# Patient Record
Sex: Male | Born: 1980 | Race: White | Hispanic: No | Marital: Single
Health system: Southern US, Community
[De-identification: ages and names within clinical notes are randomized; demographics above are authoritative.]

---

## 2002-09-18 ENCOUNTER — Ambulatory Visit (HOSPITAL_COMMUNITY): Admission: RE | Admit: 2002-09-18 | Discharge: 2002-09-18 | Payer: Self-pay | Admitting: Surgical Oncology

## 2019-04-01 DIAGNOSIS — J9811 Atelectasis: Secondary | ICD-10-CM

## 2019-04-01 DIAGNOSIS — D72829 Elevated white blood cell count, unspecified: Secondary | ICD-10-CM

## 2019-04-01 DIAGNOSIS — D473 Essential (hemorrhagic) thrombocythemia: Secondary | ICD-10-CM

## 2019-04-01 DIAGNOSIS — R109 Unspecified abdominal pain: Secondary | ICD-10-CM

## 2019-04-01 DIAGNOSIS — M7989 Other specified soft tissue disorders: Secondary | ICD-10-CM

## 2019-04-02 DIAGNOSIS — R7881 Bacteremia: Secondary | ICD-10-CM

## 2020-04-15 ENCOUNTER — Other Ambulatory Visit: Payer: Self-pay

## 2020-04-15 ENCOUNTER — Encounter (HOSPITAL_COMMUNITY): Payer: Self-pay

## 2020-04-15 ENCOUNTER — Ambulatory Visit (HOSPITAL_COMMUNITY)
Admission: EM | Admit: 2020-04-15 | Discharge: 2020-04-16 | Disposition: A | Discharge status: Home / Self Care | Payer: Self-pay | Attending: Orthopedic Surgery | Admitting: Orthopedic Surgery

## 2020-04-15 ENCOUNTER — Emergency Department (HOSPITAL_COMMUNITY): Payer: Self-pay

## 2020-04-15 DIAGNOSIS — Z20822 Contact with and (suspected) exposure to covid-19: Secondary | ICD-10-CM | POA: Insufficient documentation

## 2020-04-15 DIAGNOSIS — Z23 Encounter for immunization: Secondary | ICD-10-CM | POA: Insufficient documentation

## 2020-04-15 DIAGNOSIS — Y939 Activity, unspecified: Secondary | ICD-10-CM | POA: Insufficient documentation

## 2020-04-15 DIAGNOSIS — S64492A Injury of digital nerve of right middle finger, initial encounter: Secondary | ICD-10-CM | POA: Insufficient documentation

## 2020-04-15 DIAGNOSIS — S68620A Partial traumatic transphalangeal amputation of right index finger, initial encounter: Secondary | ICD-10-CM | POA: Insufficient documentation

## 2020-04-15 DIAGNOSIS — W409XXA Explosion of unspecified explosive materials, initial encounter: Secondary | ICD-10-CM | POA: Insufficient documentation

## 2020-04-15 DIAGNOSIS — S66221A Laceration of extensor muscle, fascia and tendon of right thumb at wrist and hand level, initial encounter: Secondary | ICD-10-CM | POA: Insufficient documentation

## 2020-04-15 DIAGNOSIS — S68622A Partial traumatic transphalangeal amputation of right middle finger, initial encounter: Secondary | ICD-10-CM | POA: Insufficient documentation

## 2020-04-15 DIAGNOSIS — S65512A Laceration of blood vessel of right middle finger, initial encounter: Secondary | ICD-10-CM | POA: Insufficient documentation

## 2020-04-15 DIAGNOSIS — S68521A Partial traumatic transphalangeal amputation of right thumb, initial encounter: Secondary | ICD-10-CM | POA: Insufficient documentation

## 2020-04-15 DIAGNOSIS — S68629A Partial traumatic transphalangeal amputation of unspecified finger, initial encounter: Secondary | ICD-10-CM

## 2020-04-15 DIAGNOSIS — Z880 Allergy status to penicillin: Secondary | ICD-10-CM | POA: Insufficient documentation

## 2020-04-15 LAB — COMPREHENSIVE METABOLIC PANEL
ALT: 17 U/L (ref 0–44)
AST: 32 U/L (ref 15–41)
Albumin: 3.7 g/dL (ref 3.5–5.0)
Alkaline Phosphatase: 72 U/L (ref 38–126)
Anion gap: 8 (ref 5–15)
BUN: 7 mg/dL (ref 6–20)
CO2: 24 mmol/L (ref 22–32)
Calcium: 9 mg/dL (ref 8.9–10.3)
Chloride: 105 mmol/L (ref 98–111)
Creatinine, Ser: 0.88 mg/dL (ref 0.61–1.24)
GFR, Estimated: >60 mL/min (ref >60)
Glucose, Bld: 135 mg/dL — ABNORMAL HIGH (ref 70–99)
Potassium: 4.1 mmol/L (ref 3.5–5.1)
Sodium: 137 mmol/L (ref 135–145)
Total Bilirubin: 0.9 mg/dL (ref 0.3–1.2)
Total Protein: 6.9 g/dL (ref 6.5–8.1)

## 2020-04-15 LAB — CBC
HCT: 44.2 % (ref 39.0–52.0)
Hemoglobin: 14.4 g/dL (ref 13.0–17.0)
MCH: 30.6 pg (ref 26.0–34.0)
MCHC: 32.6 g/dL (ref 30.0–36.0)
MCV: 93.8 fL (ref 80.0–100.0)
Platelets: 486 10*3/uL — ABNORMAL HIGH (ref 150–400)
RBC: 4.71 MIL/uL (ref 4.22–5.81)
RDW: 13 % (ref 11.5–15.5)
WBC: 14 10*3/uL — ABNORMAL HIGH (ref 4.0–10.5)
nRBC: 0 % (ref 0.0–0.2)

## 2020-04-15 LAB — ABO/RH: ABO/RH(D): B NEG

## 2020-04-15 LAB — TYPE AND SCREEN
ABO/RH(D): B NEG
Antibody Screen: NEGATIVE

## 2020-04-15 MED ORDER — HYDROMORPHONE HCL 1 MG/ML IJ SOLN
1.0000 mg | Freq: Once | INTRAMUSCULAR | Status: AC
  Administered 2020-04-15: 1 mg via INTRAVENOUS
  Filled 2020-04-15: qty 1

## 2020-04-15 MED ORDER — CEFAZOLIN SODIUM-DEXTROSE 2-4 GM/100ML-% IV SOLN
2.0000 g | Freq: Once | INTRAVENOUS | Status: AC
  Administered 2020-04-15: 2 g via INTRAVENOUS
  Filled 2020-04-15: qty 100

## 2020-04-15 MED ORDER — FENTANYL CITRATE (PF) 100 MCG/2ML IJ SOLN
50.0000 ug | INTRAMUSCULAR | Status: DC | PRN
  Administered 2020-04-15 – 2020-04-16 (×3): 50 ug via INTRAVENOUS
  Filled 2020-04-15 (×3): qty 2

## 2020-04-15 MED ORDER — HYDROMORPHONE HCL 1 MG/ML IJ SOLN
1.0000 mg | INTRAMUSCULAR | Status: DC | PRN
  Administered 2020-04-16: 1 mg via INTRAVENOUS
  Filled 2020-04-15: qty 1

## 2020-04-15 MED ORDER — TETANUS-DIPHTH-ACELL PERTUSSIS 5-2.5-18.5 LF-MCG/0.5 IM SUSY
0.5000 mL | PREFILLED_SYRINGE | Freq: Once | INTRAMUSCULAR | Status: AC
  Administered 2020-04-15: 0.5 mL via INTRAMUSCULAR
  Filled 2020-04-15: qty 0.5
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
## 2020-04-15 NOTE — ED Notes (Signed)
Pt completely undressed & in hospital gown

## 2020-04-15 NOTE — ED Notes (Addendum)
Pt endorses regular meth use, 2PPD smoker

## 2020-04-15 NOTE — ED Notes (Signed)
Pt's sister at bedside, given update on pt plan of care

## 2020-04-16 ENCOUNTER — Emergency Department (HOSPITAL_COMMUNITY): Payer: Self-pay | Admitting: Anesthesiology

## 2020-04-16 ENCOUNTER — Emergency Department (HOSPITAL_COMMUNITY): Payer: Self-pay

## 2020-04-16 ENCOUNTER — Encounter (HOSPITAL_COMMUNITY)
Admission: EM | Disposition: A | Discharge status: Home / Self Care | Payer: Self-pay | Source: Home / Self Care | Attending: Emergency Medicine

## 2020-04-16 HISTORY — PX: PERCUTANEOUS PINNING: SHX2209

## 2020-04-16 HISTORY — PX: I & D EXTREMITY: SHX5045

## 2020-04-16 LAB — SARS CORONAVIRUS 2 (TAT 6-24 HRS): SARS Coronavirus 2: NEGATIVE

## 2020-04-16 SURGERY — IRRIGATION AND DEBRIDEMENT EXTREMITY
Anesthesia: General | Laterality: Right

## 2020-04-16 MED ORDER — 0.9 % SODIUM CHLORIDE (POUR BTL) OPTIME
TOPICAL | Status: DC | PRN
  Administered 2020-04-16: 1000 mL

## 2020-04-16 MED ORDER — PROPOFOL 10 MG/ML IV BOLUS
INTRAVENOUS | Status: AC
  Filled 2020-04-16: qty 20

## 2020-04-16 MED ORDER — BUPIVACAINE HCL (PF) 0.25 % IJ SOLN
INTRAMUSCULAR | Status: AC
  Filled 2020-04-16: qty 30

## 2020-04-16 MED ORDER — DEXAMETHASONE SODIUM PHOSPHATE 10 MG/ML IJ SOLN
INTRAMUSCULAR | Status: AC
  Filled 2020-04-16: qty 2

## 2020-04-16 MED ORDER — MIDAZOLAM HCL 5 MG/5ML IJ SOLN
INTRAMUSCULAR | Status: DC | PRN
  Administered 2020-04-16: 2 mg via INTRAVENOUS

## 2020-04-16 MED ORDER — PROPOFOL 10 MG/ML IV BOLUS
INTRAVENOUS | Status: DC | PRN
  Administered 2020-04-16: 200 mg via INTRAVENOUS

## 2020-04-16 MED ORDER — ONDANSETRON HCL 4 MG/2ML IJ SOLN
INTRAMUSCULAR | Status: AC
  Filled 2020-04-16: qty 2

## 2020-04-16 MED ORDER — KETOROLAC TROMETHAMINE 30 MG/ML IJ SOLN
INTRAMUSCULAR | Status: AC
  Filled 2020-04-16: qty 1

## 2020-04-16 MED ORDER — LACTATED RINGERS IV SOLN: INTRAVENOUS | Status: DC | PRN

## 2020-04-16 MED ORDER — SULFAMETHOXAZOLE-TRIMETHOPRIM 800-160 MG PO TABS: 1.0000 | ORAL_TABLET | Freq: Two times a day (BID) | ORAL | 0 refills | Status: AC

## 2020-04-16 MED ORDER — KETAMINE HCL 50 MG/5ML IJ SOSY
PREFILLED_SYRINGE | INTRAMUSCULAR | Status: AC
  Filled 2020-04-16: qty 5

## 2020-04-16 MED ORDER — OXYCODONE HCL 5 MG/5ML PO SOLN
5.0000 mg | Freq: Once | ORAL | Status: DC | PRN
Start: 2020-04-16 — End: 2020-04-16

## 2020-04-16 MED ORDER — OXYCODONE-ACETAMINOPHEN 5-325 MG PO TABS: ORAL_TABLET | ORAL | 0 refills | Status: AC

## 2020-04-16 MED ORDER — FENTANYL CITRATE (PF) 250 MCG/5ML IJ SOLN
INTRAMUSCULAR | Status: AC
  Filled 2020-04-16: qty 5

## 2020-04-16 MED ORDER — FENTANYL CITRATE (PF) 250 MCG/5ML IJ SOLN
INTRAMUSCULAR | Status: DC | PRN
  Administered 2020-04-16: 150 ug via INTRAVENOUS
  Administered 2020-04-16: 50 ug via INTRAVENOUS
  Administered 2020-04-16: 100 ug via INTRAVENOUS

## 2020-04-16 MED ORDER — KETOROLAC TROMETHAMINE 30 MG/ML IJ SOLN
30.0000 mg | Freq: Once | INTRAMUSCULAR | Status: AC
  Administered 2020-04-16: 30 mg via INTRAVENOUS

## 2020-04-16 MED ORDER — CEFAZOLIN SODIUM-DEXTROSE 2-3 GM-%(50ML) IV SOLR
INTRAVENOUS | Status: DC | PRN
  Administered 2020-04-16: 2 g via INTRAVENOUS

## 2020-04-16 MED ORDER — DEXMEDETOMIDINE (PRECEDEX) IN NS 20 MCG/5ML (4 MCG/ML) IV SYRINGE
PREFILLED_SYRINGE | INTRAVENOUS | Status: AC
  Filled 2020-04-16: qty 10

## 2020-04-16 MED ORDER — KETAMINE HCL 10 MG/ML IJ SOLN
INTRAMUSCULAR | Status: DC | PRN
  Administered 2020-04-16: 30 mg via INTRAVENOUS
  Administered 2020-04-16: 20 mg via INTRAVENOUS

## 2020-04-16 MED ORDER — BUPIVACAINE HCL (PF) 0.25 % IJ SOLN
INTRAMUSCULAR | Status: DC | PRN
  Administered 2020-04-16: 20 mL

## 2020-04-16 MED ORDER — HYDROMORPHONE HCL 1 MG/ML IJ SOLN: 0.2500 mg | INTRAMUSCULAR | Status: DC | PRN

## 2020-04-16 MED ORDER — ONDANSETRON HCL 4 MG/2ML IJ SOLN
INTRAMUSCULAR | Status: DC | PRN
  Administered 2020-04-16: 4 mg via INTRAVENOUS

## 2020-04-16 MED ORDER — PROMETHAZINE HCL 25 MG/ML IJ SOLN: 6.2500 mg | INTRAMUSCULAR | Status: DC | PRN

## 2020-04-16 MED ORDER — OXYCODONE HCL 5 MG PO TABS: 5.0000 mg | ORAL_TABLET | Freq: Once | ORAL | Status: DC | PRN

## 2020-04-16 MED ORDER — PHENYLEPHRINE 40 MCG/ML (10ML) SYRINGE FOR IV PUSH (FOR BLOOD PRESSURE SUPPORT)
PREFILLED_SYRINGE | INTRAVENOUS | Status: AC
  Filled 2020-04-16: qty 10

## 2020-04-16 MED ORDER — EPHEDRINE 5 MG/ML INJ
INTRAVENOUS | Status: AC
  Filled 2020-04-16: qty 10

## 2020-04-16 MED ORDER — HYDROMORPHONE HCL 1 MG/ML IJ SOLN
INTRAMUSCULAR | Status: AC
  Filled 2020-04-16: qty 1

## 2020-04-16 MED ORDER — DIPHENHYDRAMINE HCL 50 MG/ML IJ SOLN
INTRAMUSCULAR | Status: DC | PRN
  Administered 2020-04-16: 25 mg via INTRAVENOUS

## 2020-04-16 MED ORDER — CEFAZOLIN SODIUM-DEXTROSE 2-4 GM/100ML-% IV SOLN
INTRAVENOUS | Status: AC
  Filled 2020-04-16: qty 100

## 2020-04-16 MED ORDER — DEXAMETHASONE SODIUM PHOSPHATE 10 MG/ML IJ SOLN
INTRAMUSCULAR | Status: DC | PRN
  Administered 2020-04-16: 4 mg via INTRAVENOUS

## 2020-04-16 MED ORDER — DIPHENHYDRAMINE HCL 50 MG/ML IJ SOLN
INTRAMUSCULAR | Status: AC
  Filled 2020-04-16: qty 1

## 2020-04-16 MED ORDER — SUCCINYLCHOLINE CHLORIDE 20 MG/ML IJ SOLN
INTRAMUSCULAR | Status: DC | PRN
  Administered 2020-04-16: 120 mg via INTRAVENOUS

## 2020-04-16 MED ORDER — ACETAMINOPHEN 10 MG/ML IV SOLN: 1000.0000 mg | Freq: Once | INTRAVENOUS | Status: DC | PRN

## 2020-04-16 MED ORDER — LIDOCAINE 2% (20 MG/ML) 5 ML SYRINGE
INTRAMUSCULAR | Status: AC
  Filled 2020-04-16: qty 15

## 2020-04-16 MED ORDER — LIDOCAINE 2% (20 MG/ML) 5 ML SYRINGE
INTRAMUSCULAR | Status: DC | PRN
  Administered 2020-04-16: 60 mg via INTRAVENOUS

## 2020-04-16 MED ORDER — MIDAZOLAM HCL 2 MG/2ML IJ SOLN
INTRAMUSCULAR | Status: AC
  Filled 2020-04-16: qty 2

## 2020-04-16 MED ORDER — DEXMEDETOMIDINE (PRECEDEX) IN NS 20 MCG/5ML (4 MCG/ML) IV SYRINGE
PREFILLED_SYRINGE | INTRAVENOUS | Status: DC | PRN
  Administered 2020-04-16: 8 ug via INTRAVENOUS
  Administered 2020-04-16: 12 ug via INTRAVENOUS
  Administered 2020-04-16: 20 ug via INTRAVENOUS

## 2020-04-16 SURGICAL SUPPLY — 58 items
ADAPTER CATH SYR TO TUBING 38M (ADAPTER) IMPLANT
BNDG COHESIVE 2X5 TAN STRL LF (GAUZE/BANDAGES/DRESSINGS) IMPLANT
BNDG ELASTIC 3X5.8 VLCR STR LF (GAUZE/BANDAGES/DRESSINGS) ×4 IMPLANT
BNDG ELASTIC 4X5.8 VLCR STR LF (GAUZE/BANDAGES/DRESSINGS) ×2 IMPLANT
BNDG ESMARK 4X9 LF (GAUZE/BANDAGES/DRESSINGS) ×2 IMPLANT
BNDG GAUZE ELAST 4 BULKY (GAUZE/BANDAGES/DRESSINGS) ×2 IMPLANT
CANNULA VESSEL 3MM 2 BLNT TIP (CANNULA) IMPLANT
CORD BIPOLAR FORCEPS 12FT (ELECTRODE) ×2 IMPLANT
COVER SURGICAL LIGHT HANDLE (MISCELLANEOUS) ×2 IMPLANT
COVER WAND RF STERILE (DRAPES) ×2 IMPLANT
CUFF TOURN SGL QUICK 18X4 (TOURNIQUET CUFF) IMPLANT
CUFF TOURN SGL QUICK 24 (TOURNIQUET CUFF)
CUFF TRNQT CYL 24X4X16.5-23 (TOURNIQUET CUFF) IMPLANT
DECANTER SPIKE VIAL GLASS SM (MISCELLANEOUS) ×2 IMPLANT
DRAIN PENROSE 1/4X12 LTX STRL (WOUND CARE) IMPLANT
DRSG PAD ABDOMINAL 8X10 ST (GAUZE/BANDAGES/DRESSINGS) ×4 IMPLANT
GAUZE SPONGE 4X4 12PLY STRL (GAUZE/BANDAGES/DRESSINGS) ×2 IMPLANT
GAUZE XEROFORM 1X8 LF (GAUZE/BANDAGES/DRESSINGS) ×2 IMPLANT
GAUZE XEROFORM 5X9 LF (GAUZE/BANDAGES/DRESSINGS) ×2 IMPLANT
GLOVE BIO SURGEON STRL SZ7.5 (GLOVE) ×2 IMPLANT
GLOVE BIOGEL PI IND STRL 8.5 (GLOVE) IMPLANT
GLOVE BIOGEL PI INDICATOR 8.5 (GLOVE)
GLOVE BIOGEL PI ORTHO PRO SZ8 (GLOVE)
GLOVE PI ORTHO PRO STRL SZ8 (GLOVE) IMPLANT
GLOVE SRG 8 PF TXTR STRL LF DI (GLOVE) ×1 IMPLANT
GLOVE SURG ORTHO 8.0 STRL STRW (GLOVE) IMPLANT
GLOVE SURG UNDER POLY LF SZ8 (GLOVE) ×2
GOWN STRL REUS W/ TWL LRG LVL3 (GOWN DISPOSABLE) ×1 IMPLANT
GOWN STRL REUS W/ TWL XL LVL3 (GOWN DISPOSABLE) ×1 IMPLANT
GOWN STRL REUS W/TWL LRG LVL3 (GOWN DISPOSABLE) ×1
GOWN STRL REUS W/TWL XL LVL3 (GOWN DISPOSABLE) ×2
K-WIRE .035 (WIRE) ×4
KIT BASIN OR (CUSTOM PROCEDURE TRAY) ×2 IMPLANT
KIT TURNOVER KIT B (KITS) ×2 IMPLANT
KWIRE .035 (WIRE) ×2 IMPLANT
LOOP VESSEL MAXI BLUE (MISCELLANEOUS) IMPLANT
MANIFOLD NEPTUNE II (INSTRUMENTS) IMPLANT
NEEDLE HYPO 25X1 1.5 SAFETY (NEEDLE) ×2 IMPLANT
NS IRRIG 1000ML POUR BTL (IV SOLUTION) ×2 IMPLANT
PACK ORTHO EXTREMITY (CUSTOM PROCEDURE TRAY) ×2 IMPLANT
PAD ARMBOARD 7.5X6 YLW CONV (MISCELLANEOUS) ×4 IMPLANT
PAD CAST 3X4 CTTN HI CHSV (CAST SUPPLIES) ×1 IMPLANT
PADDING CAST COTTON 3X4 STRL (CAST SUPPLIES) ×2
SET CYSTO W/LG BORE CLAMP LF (SET/KITS/TRAYS/PACK) ×2 IMPLANT
SOL PREP POV-IOD 4OZ 10% (MISCELLANEOUS) ×4 IMPLANT
SPONGE LAP 4X18 RFD (DISPOSABLE) ×2 IMPLANT
SUT ETHILON 4 0 P 3 18 (SUTURE) IMPLANT
SUT ETHILON 4 0 PS 2 18 (SUTURE) IMPLANT
SUT MERSILENE 4 0 P 3 (SUTURE) ×2 IMPLANT
SUT MON AB 5-0 P3 18 (SUTURE) ×10 IMPLANT
SWAB COLLECTION DEVICE MRSA (MISCELLANEOUS) IMPLANT
SYR 20ML LL LF (SYRINGE) IMPLANT
SYR CONTROL 10ML LL (SYRINGE) IMPLANT
TOWEL GREEN STERILE (TOWEL DISPOSABLE) ×2 IMPLANT
TUBE CONNECTING 12X1/4 (SUCTIONS) ×2 IMPLANT
TUBE FEEDING ENTERAL 5FR 16IN (TUBING) IMPLANT
UNDERPAD 30X36 HEAVY ABSORB (UNDERPADS AND DIAPERS) ×2 IMPLANT
YANKAUER SUCT BULB TIP NO VENT (SUCTIONS) ×2 IMPLANT

## 2020-04-16 NOTE — Anesthesia Preprocedure Evaluation (Addendum)
Anesthesia Evaluation  Patient identified by MRN, date of birth, ID band Patient awake    Reviewed: Allergy & Precautions, NPO status , Patient's Chart, lab work & pertinent test results  Airway Mallampati: II  TM Distance: >3 FB Neck ROM: Full    Dental  (+) Poor Dentition, Missing   Pulmonary Current SmokerPatient did not abstain from smoking.,    Pulmonary exam normal breath sounds clear to auscultation       Cardiovascular negative cardio ROS Normal cardiovascular exam Rhythm:Regular Rate:Normal     Neuro/Psych negative neurological ROS     GI/Hepatic negative GI ROS, (+)     substance abuse  methamphetamine use,   Endo/Other  negative endocrine ROS  Renal/GU negative Renal ROS     Musculoskeletal partial amputation of right thumb, pointer & middle finger   Abdominal (+) + obese,   Peds  Hematology negative hematology ROS (+)   Anesthesia Other Findings RIGHT HAND EXPLOSION  Reproductive/Obstetrics                            Anesthesia Physical Anesthesia Plan  ASA: II and emergent  Anesthesia Plan: General   Post-op Pain Management:    Induction: Intravenous  PONV Risk Score and Plan: 2 and Ondansetron, Dexamethasone, Midazolam and Treatment may vary due to age or medical condition  Airway Management Planned: Oral ETT  Additional Equipment:   Intra-op Plan:   Post-operative Plan: Extubation in OR  Informed Consent: I have reviewed the patients History and Physical, chart, labs and discussed the procedure including the risks, benefits and alternatives for the proposed anesthesia with the patient or authorized representative who has indicated his/her understanding and acceptance.     Dental advisory given  Plan Discussed with: CRNA  Anesthesia Plan Comments:        Anesthesia Quick Evaluation

## 2020-04-16 NOTE — Anesthesia Postprocedure Evaluation (Signed)
Anesthesia Post Note  Patient: Russell Montoya  Procedure(s) Performed: IRRIGATION AND DEBRIDEMENT, REVISION OF AMPUTATION, REPAIR OF MULTIPLE LACERATIONS (Right ) PERCUTANEOUS PINNING RIGHT THUMB AND MIDDLE FINGER (Right )     Patient location during evaluation: PACU Anesthesia Type: General Level of consciousness: awake and alert Pain management: pain level controlled Vital Signs Assessment: post-procedure vital signs reviewed and stable Respiratory status: spontaneous breathing, nonlabored ventilation, respiratory function stable and patient connected to nasal cannula oxygen Cardiovascular status: blood pressure returned to baseline and stable Postop Assessment: no apparent nausea or vomiting Anesthetic complications: no   No complications documented.  Last Vitals:  Vitals:   04/16/20 1000 04/16/20 1030  BP: 124/70 124/67  Pulse: 67 73  Resp: 18 19  Temp:  (!) 36.3 C  SpO2: 99% 96%    Last Pain:  Vitals:   04/16/20 1030  TempSrc:   PainSc: 0-No pain                 Tiajuana Amass

## 2020-04-16 NOTE — Anesthesia Procedure Notes (Signed)
Procedure Name: Intubation Date/Time: 04/16/2020 4:23 AM Performed by: Crosby Oriordan T, CRNA Pre-anesthesia Checklist: Patient identified, Emergency Drugs available, Suction available and Patient being monitored Patient Re-evaluated:Patient Re-evaluated prior to induction Oxygen Delivery Method: Circle system utilized Preoxygenation: Pre-oxygenation with 100% oxygen Induction Type: IV induction Ventilation: Mask ventilation without difficulty Laryngoscope Size: Mac and 4 Grade View: Grade I Tube type: Oral Tube size: 7.0 mm Number of attempts: 1 Airway Equipment and Method: Stylet and Oral airway Placement Confirmation: ETT inserted through vocal cords under direct vision,  positive ETCO2 and breath sounds checked- equal and bilateral Secured at: 22 cm Tube secured with: Tape Dental Injury: Teeth and Oropharynx as per pre-operative assessment
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
## 2020-04-16 NOTE — Discharge Instructions (Addendum)
[facility] Instructions Hand Surgery  Wound Care: Keep your hand elevated above the level of your heart.  Do not allow it to dangle by your side.  Keep the dressing dry and do not remove it unless your doctor advises you to do so.  He will usually change it at the time of your post-op visit.  Moving your fingers is advised to stimulate circulation but will depend on the site of your surgery.  If you have a splint applied, your doctor will advise you regarding movement.  Activity: Do not drive or operate machinery today.  Rest today and then you may return to your normal activity and work as indicated by your physician.  Diet:  Drink liquids today or eat a light diet.  You may resume a regular diet tomorrow.    General expectations: Pain for two to three days. Fingers may become slightly swollen.  Call your doctor if any of the following occur: Severe pain not relieved by pain medication. Elevated temperature. Dressing soaked with blood. Inability to move fingers. White or bluish color to fingers.   General Anesthesia, Adult, Care After This sheet gives you information about how to care for yourself after your procedure. Your health care provider may also give you more specific instructions. If you have problems or questions, contact your health care provider. What can I expect after the procedure? After the procedure, the following side effects are common:  Pain or discomfort at the IV site.  Nausea.  Vomiting.  Sore throat.  Trouble concentrating.  Feeling cold or chills.  Feeling weak or tired.  Sleepiness and fatigue.  Soreness and body aches. These side effects can affect parts of the body that were not involved in surgery. Follow these instructions at home: For the time period you were told by your health care provider:  Rest.  Do not participate in activities where you could fall or become injured.  Do not drive or use machinery.  Do not drink  alcohol.  Do not take sleeping pills or medicines that cause drowsiness.  Do not make important decisions or sign legal documents.  Do not take care of children on your own.   Eating and drinking  Follow any instructions from your health care provider about eating or drinking restrictions.  When you feel hungry, start by eating small amounts of foods that are soft and easy to digest (bland), such as toast. Gradually return to your regular diet.  Drink enough fluid to keep your urine pale yellow.  If you vomit, rehydrate by drinking water, juice, or clear broth. General instructions  If you have sleep apnea, surgery and certain medicines can increase your risk for breathing problems. Follow instructions from your health care provider about wearing your sleep device: ? Anytime you are sleeping, including during daytime naps. ? While taking prescription pain medicines, sleeping medicines, or medicines that make you drowsy.  Have a responsible adult stay with you for the time you are told. It is important to have someone help care for you until you are awake and alert.  Return to your normal activities as told by your health care provider. Ask your health care provider what activities are safe for you.  Take over-the-counter and prescription medicines only as told by your health care provider.  If you smoke, do not smoke without supervision.  Keep all follow-up visits as told by your health care provider. This is important. Contact a health care provider if:  You have nausea  or vomiting that does not get better with medicine.  You cannot eat or drink without vomiting.  You have pain that does not get better with medicine.  You are unable to pass urine.  You develop a skin rash.  You have a fever.  You have redness around your IV site that gets worse. Get help right away if:  You have difficulty breathing.  You have chest pain.  You have blood in your urine or stool, or  you vomit blood. Summary  After the procedure, it is common to have a sore throat or nausea. It is also common to feel tired.  Have a responsible adult stay with you for the time you are told. It is important to have someone help care for you until you are awake and alert.  When you feel hungry, start by eating small amounts of foods that are soft and easy to digest (bland), such as toast. Gradually return to your regular diet.  Drink enough fluid to keep your urine pale yellow.  Return to your normal activities as told by your health care provider. Ask your health care provider what activities are safe for you. This information is not intended to replace advice given to you by your health care provider. Make sure you discuss any questions you have with your health care provider. Document Revised: 11/04/2019 Document Reviewed: 06/03/2019 Elsevier Patient Education  2021 [employer].
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
## 2020-04-16 NOTE — ED Notes (Signed)
Family numbers:  Dad Nicole Kindred): [phone] Sister Brayton Layman): [phone]

## 2020-04-16 NOTE — Progress Notes (Signed)
Discharge criteria met. Patient is awake and alert, vital signs stable. Patient reports no pain at this time. Discharge instructions printed and discussed with patient and patietns father in person in lobby, both acknowledged understanding of instructions. Copy of instructions provided to patient. Patient to pick up prescriptions on way home at [facility] pharmacy. Patient assisted with dressing, peripheral IVs removed. Patient escorted to lobby by RN via wheelchair, patients family to drive patient home.

## 2020-04-16 NOTE — Transfer of Care (Signed)
Immediate Anesthesia Transfer of Care Note  Patient: Russell Montoya  Procedure(s) Performed: IRRIGATION AND DEBRIDEMENT, REVISION OF AMPUTATION, REPAIR OF MULTIPLE LACERATIONS (Right ) PERCUTANEOUS PINNING RIGHT THUMB AND MIDDLE FINGER (Right )  Patient Location: PACU  Anesthesia Type:General  Level of Consciousness: awake, alert , oriented and patient cooperative  Airway & Oxygen Therapy: Patient Spontanous Breathing and Patient connected to face mask oxygen  Post-op Assessment: Report given to RN and Post -op Vital signs reviewed and stable  Post vital signs: Reviewed and stable  Last Vitals:  Vitals Value Taken Time  BP 122/72 04/16/20 0745  Temp 36.7 C 04/16/20 0745  Pulse 90 04/16/20 0753  Resp 27 04/16/20 0753  SpO2 96 % 04/16/20 0753  Vitals shown include unvalidated device data.  Last Pain:  Vitals:   04/16/20 0207  TempSrc:   PainSc: 10-Worst pain ever         Complications: No complications documented.

## 2020-04-17 ENCOUNTER — Encounter (HOSPITAL_COMMUNITY): Payer: Self-pay | Admitting: Orthopedic Surgery

## 2021-04-22 DIAGNOSIS — I361 Nonrheumatic tricuspid (valve) insufficiency: Secondary | ICD-10-CM

## 2021-04-22 DIAGNOSIS — I340 Nonrheumatic mitral (valve) insufficiency: Secondary | ICD-10-CM
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
